# Patient Record
Sex: Female | Born: 1985 | Race: Black or African American | Hispanic: No | Marital: Single | Smoking: Current every day smoker
Health system: Southern US, Community
[De-identification: ages and names within clinical notes are randomized; demographics above are authoritative.]

---
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
## 2006-05-09 ENCOUNTER — Emergency Department (HOSPITAL_COMMUNITY): Admission: EM | Admit: 2006-05-09 | Discharge: 2006-05-10 | Payer: Self-pay | Admitting: Emergency Medicine

## 2006-05-31 ENCOUNTER — Emergency Department (HOSPITAL_COMMUNITY): Admission: EM | Admit: 2006-05-31 | Discharge: 2006-05-31 | Payer: Self-pay | Admitting: Emergency Medicine

## 2006-08-30 ENCOUNTER — Emergency Department (HOSPITAL_COMMUNITY): Admission: EM | Admit: 2006-08-30 | Discharge: 2006-08-30 | Payer: Self-pay | Admitting: Family Medicine

## 2006-10-04 ENCOUNTER — Emergency Department (HOSPITAL_COMMUNITY): Admission: EM | Admit: 2006-10-04 | Discharge: 2006-10-04 | Payer: Self-pay | Admitting: Emergency Medicine

## 2006-11-01 ENCOUNTER — Emergency Department (HOSPITAL_COMMUNITY): Admission: EM | Admit: 2006-11-01 | Discharge: 2006-11-01 | Payer: Self-pay | Admitting: Emergency Medicine

## 2006-11-06 ENCOUNTER — Emergency Department (HOSPITAL_COMMUNITY): Admission: EM | Admit: 2006-11-06 | Discharge: 2006-11-06 | Payer: Self-pay | Admitting: Emergency Medicine

## 2006-11-08 ENCOUNTER — Emergency Department (HOSPITAL_COMMUNITY): Admission: EM | Admit: 2006-11-08 | Discharge: 2006-11-08 | Payer: Self-pay | Admitting: Emergency Medicine

## 2006-12-23 ENCOUNTER — Emergency Department (HOSPITAL_COMMUNITY): Admission: EM | Admit: 2006-12-23 | Discharge: 2006-12-23 | Payer: Self-pay | Admitting: Emergency Medicine

## 2007-05-11 ENCOUNTER — Emergency Department (HOSPITAL_COMMUNITY): Admission: EM | Admit: 2007-05-11 | Discharge: 2007-05-11 | Payer: Self-pay | Admitting: Emergency Medicine

## 2007-12-11 ENCOUNTER — Emergency Department (HOSPITAL_COMMUNITY): Admission: EM | Admit: 2007-12-11 | Discharge: 2007-12-11 | Payer: Self-pay | Admitting: Emergency Medicine
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
## 2008-06-28 ENCOUNTER — Emergency Department (HOSPITAL_COMMUNITY): Admission: EM | Admit: 2008-06-28 | Discharge: 2008-06-28 | Payer: Self-pay | Admitting: Emergency Medicine

## 2008-08-18 ENCOUNTER — Emergency Department (HOSPITAL_COMMUNITY): Admission: EM | Admit: 2008-08-18 | Discharge: 2008-08-18 | Payer: Self-pay | Admitting: Emergency Medicine

## 2009-05-13 ENCOUNTER — Emergency Department (HOSPITAL_COMMUNITY): Admission: EM | Admit: 2009-05-13 | Discharge: 2009-05-13 | Payer: Self-pay | Admitting: Emergency Medicine

## 2009-07-11 ENCOUNTER — Emergency Department (HOSPITAL_COMMUNITY): Admission: EM | Admit: 2009-07-11 | Discharge: 2009-07-11 | Payer: Self-pay | Admitting: Emergency Medicine

## 2009-07-14 ENCOUNTER — Inpatient Hospital Stay (HOSPITAL_COMMUNITY): Admission: AD | Admit: 2009-07-14 | Discharge: 2009-07-14 | Payer: Self-pay | Admitting: Obstetrics & Gynecology

## 2009-10-27 IMAGING — CR DG CHEST 2V
2 series · 2 of 2 positions shown · non-contrast
Comparison: 12/23/2006

CLINICAL DATA: 22-year-old with cough.

CHEST - 2 VIEW

[w chest pa]
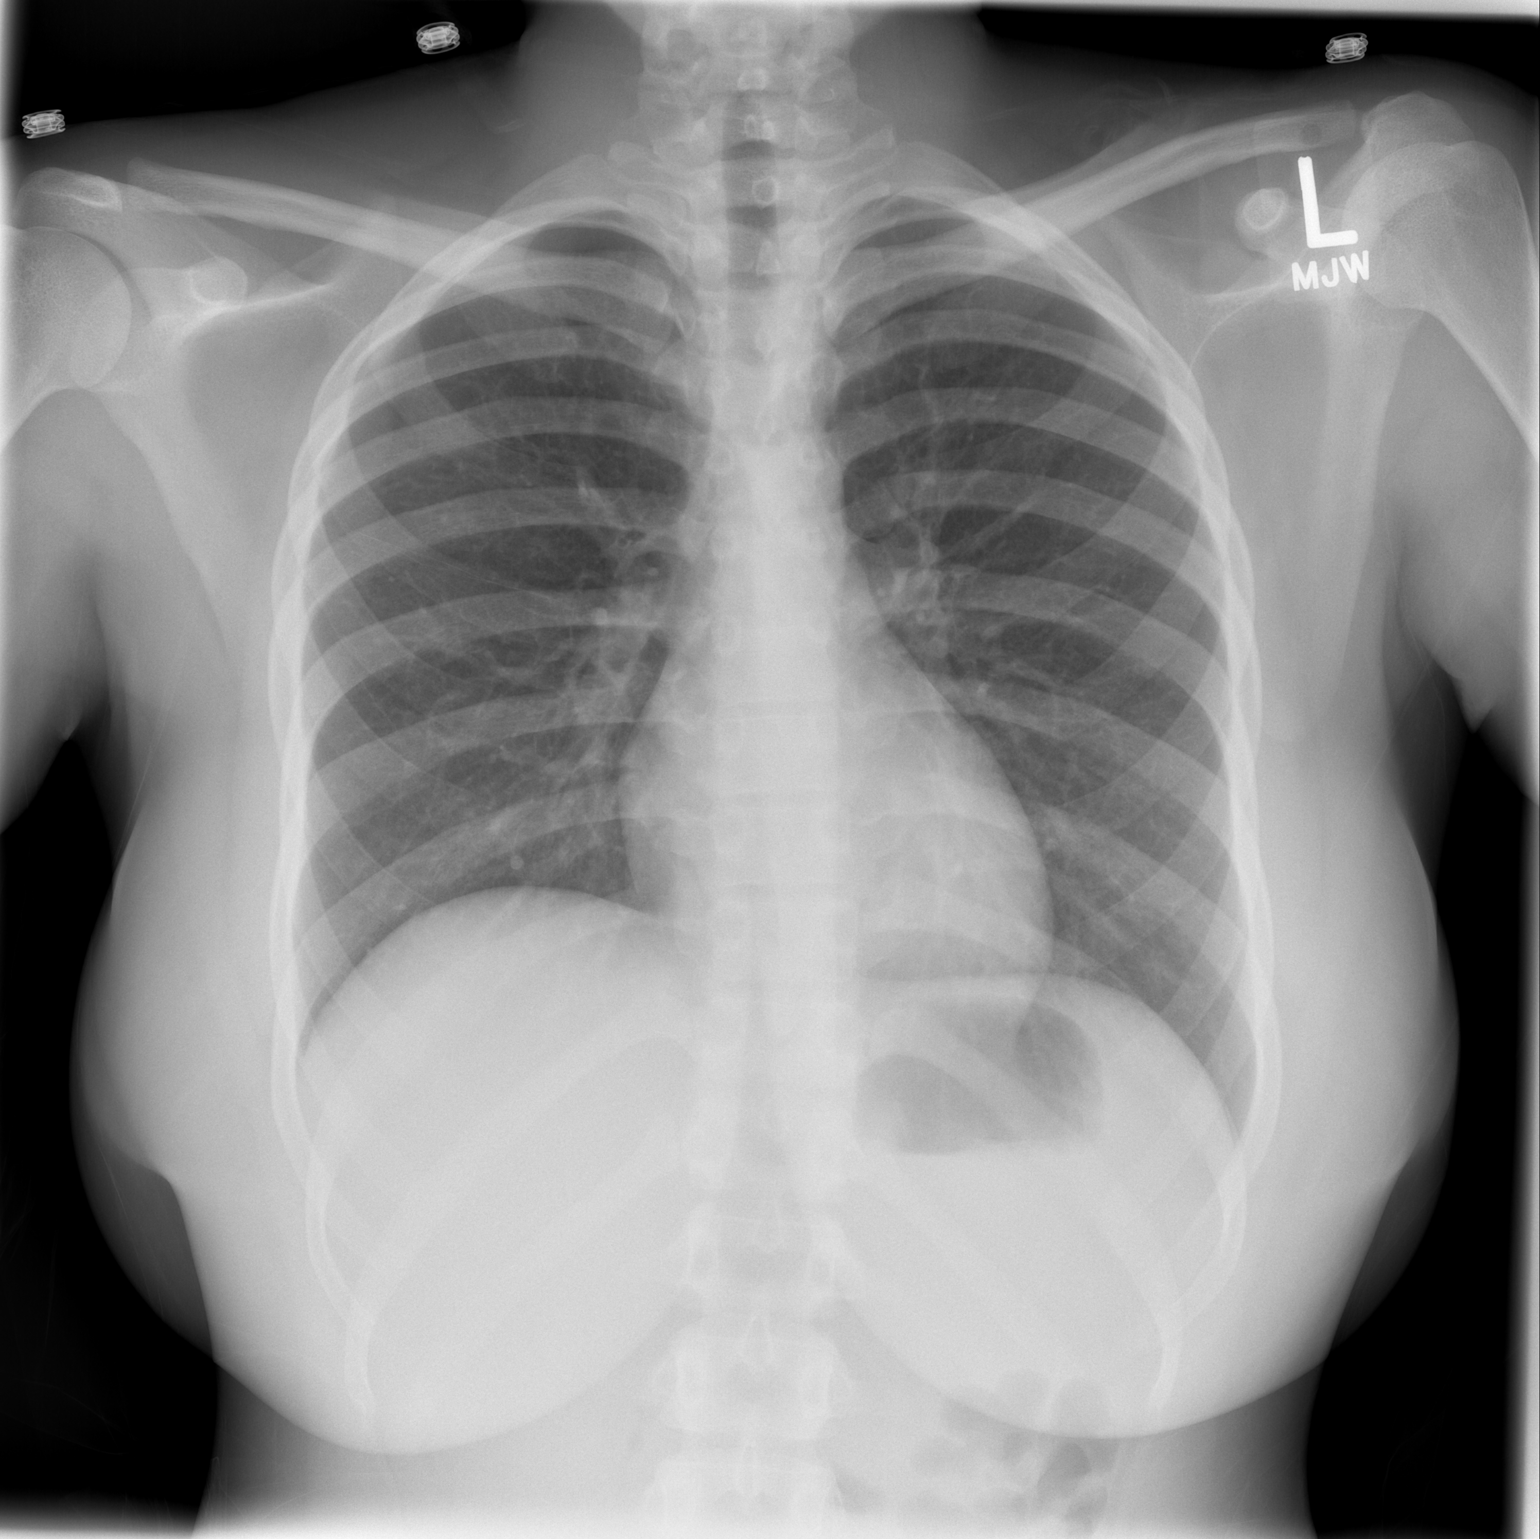

[w chest lat]
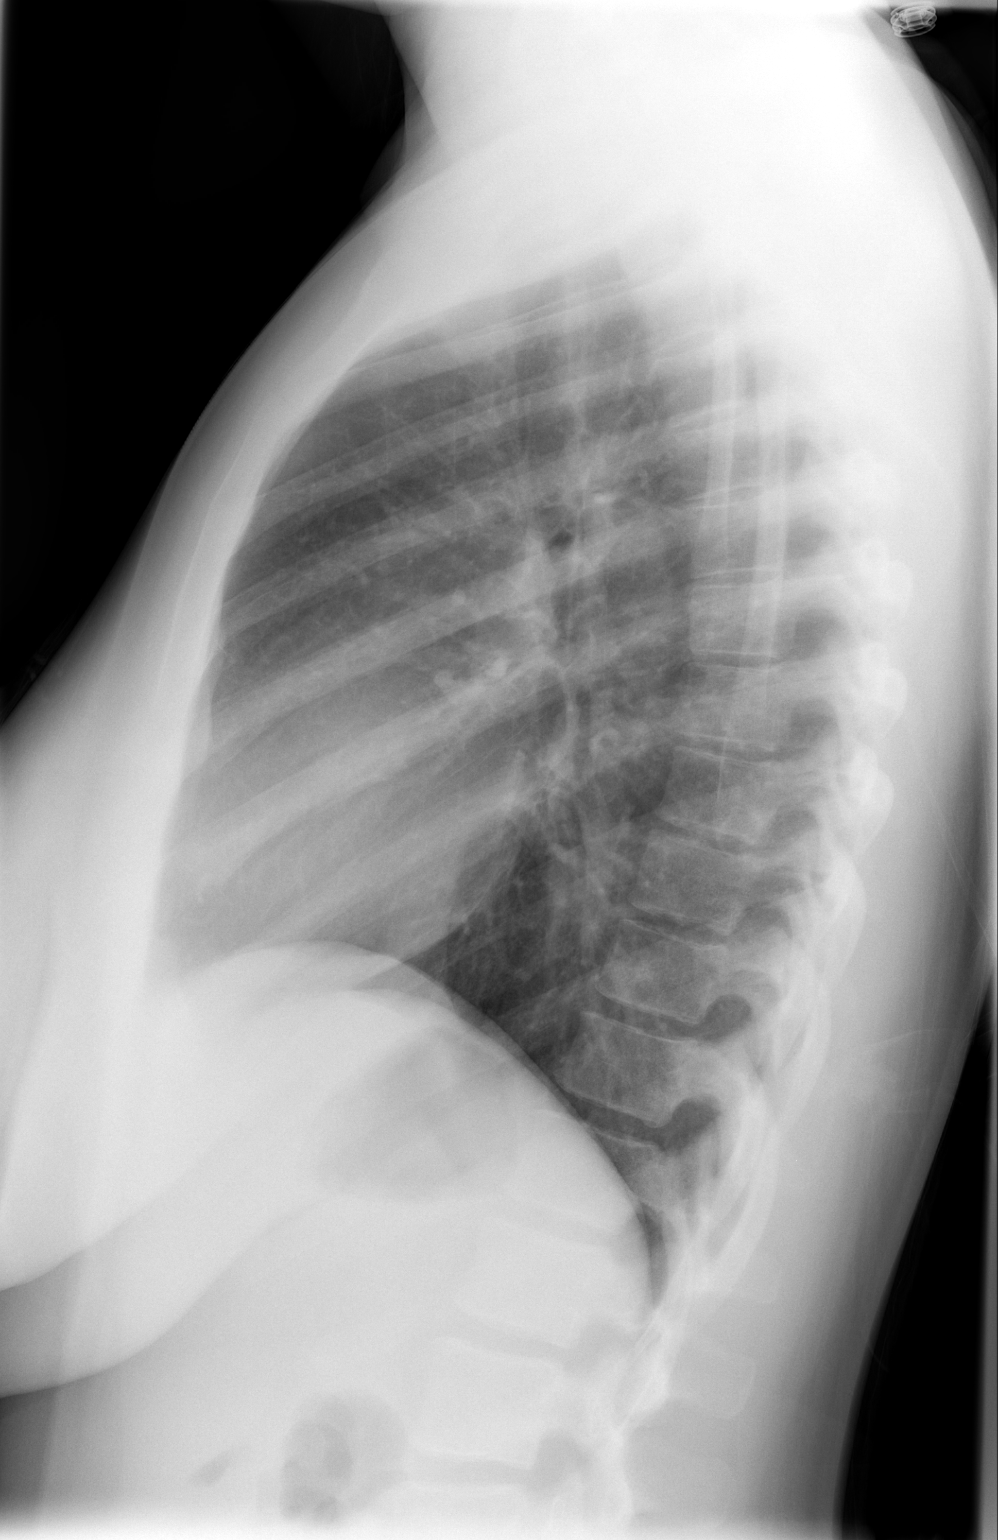

[2 of 2 positions shown; findings below may reference images not displayed]

FINDINGS: Two views of the chest demonstrate clear lungs.  Heart
and mediastinum are within normal limits.  The trachea is midline.
Bony structures are intact. There is a round lucency along the
lateral aspect of the left clavicle which is indeterminate but
possibly related to the marker on the study.
IMPRESSION: No acute chest findings.

## 2010-01-04 ENCOUNTER — Emergency Department (HOSPITAL_COMMUNITY): Admission: EM | Admit: 2010-01-04 | Discharge: 2010-01-04 | Payer: Self-pay | Admitting: Emergency Medicine

## 2010-05-17 LAB — CULTURE, ROUTINE-ABSCESS: Culture: NO GROWTH

## 2010-05-24 LAB — URINALYSIS, ROUTINE W REFLEX MICROSCOPIC
Glucose, UA: NEGATIVE mg/dL
Ketones, ur: NEGATIVE mg/dL
Leukocytes, UA: NEGATIVE
Nitrite: NEGATIVE
Protein, ur: 30 mg/dL — AB
Protein, ur: NEGATIVE mg/dL
Urobilinogen, UA: 0.2 mg/dL (ref 0.0–1.0)
pH: 5.5 (ref 5.0–8.0)

## 2010-05-24 LAB — WET PREP, GENITAL
Clue Cells Wet Prep HPF POC: NONE SEEN
Trich, Wet Prep: NONE SEEN
Yeast Wet Prep HPF POC: NONE SEEN

## 2010-05-24 LAB — URINE MICROSCOPIC-ADD ON

## 2010-06-13 LAB — URINALYSIS, ROUTINE W REFLEX MICROSCOPIC
Protein, ur: NEGATIVE mg/dL
Urobilinogen, UA: 1 mg/dL (ref 0.0–1.0)

## 2010-06-13 LAB — URINE MICROSCOPIC-ADD ON

## 2010-06-15 LAB — CBC
HCT: 39 % (ref 36.0–46.0)
Hemoglobin: 13 g/dL (ref 12.0–15.0)
MCHC: 33.2 g/dL (ref 30.0–36.0)
RBC: 4.66 MIL/uL (ref 3.87–5.11)
RDW: 14.2 % (ref 11.5–15.5)

## 2010-06-15 LAB — URINALYSIS, ROUTINE W REFLEX MICROSCOPIC
Bilirubin Urine: NEGATIVE
Hgb urine dipstick: NEGATIVE
Ketones, ur: NEGATIVE mg/dL
Nitrite: NEGATIVE
pH: 6 (ref 5.0–8.0)

## 2010-06-15 LAB — DIFFERENTIAL
Basophils Absolute: 0 10*3/uL (ref 0.0–0.1)
Eosinophils Relative: 1 % (ref 0–5)
Lymphocytes Relative: 21 % (ref 12–46)
Monocytes Absolute: 0.3 10*3/uL (ref 0.1–1.0)
Monocytes Relative: 6 % (ref 3–12)
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
## 2010-11-28 LAB — POCT PREGNANCY, URINE: Operator id: 257131

## 2010-11-28 LAB — WET PREP, GENITAL

## 2010-11-28 LAB — URINE MICROSCOPIC-ADD ON

## 2010-11-28 LAB — URINALYSIS, ROUTINE W REFLEX MICROSCOPIC
Bilirubin Urine: NEGATIVE
Glucose, UA: NEGATIVE
Ketones, ur: NEGATIVE
pH: 6

## 2010-12-03 ENCOUNTER — Emergency Department (HOSPITAL_COMMUNITY)
Admission: EM | Admit: 2010-12-03 | Discharge: 2010-12-03 | Disposition: A | Discharge status: Home / Self Care | Payer: Self-pay | Attending: Emergency Medicine | Admitting: Emergency Medicine

## 2010-12-03 DIAGNOSIS — IMO0002 Reserved for concepts with insufficient information to code with codable children: Secondary | ICD-10-CM | POA: Insufficient documentation

## 2010-12-03 DIAGNOSIS — T169XXA Foreign body in ear, unspecified ear, initial encounter: Secondary | ICD-10-CM | POA: Insufficient documentation

## 2010-12-05 LAB — CBC
HCT: 42.6
Hemoglobin: 13.9
MCHC: 32.8
MCV: 83
RDW: 13.8

## 2010-12-05 LAB — DIFFERENTIAL
Basophils Absolute: 0
Basophils Relative: 0
Eosinophils Relative: 2
Monocytes Absolute: 0.3

## 2010-12-05 LAB — RPR: RPR Ser Ql: NONREACTIVE

## 2010-12-14 LAB — URINALYSIS, ROUTINE W REFLEX MICROSCOPIC
Bilirubin Urine: NEGATIVE
Glucose, UA: NEGATIVE
Hgb urine dipstick: NEGATIVE
Ketones, ur: NEGATIVE
Nitrite: NEGATIVE
Protein, ur: NEGATIVE
Specific Gravity, Urine: 1.025
Urobilinogen, UA: 0.2
pH: 5.5

## 2010-12-14 LAB — CBC
HCT: 41.2
Hemoglobin: 13.4
MCHC: 32.5
MCV: 80.9
Platelets: 262
RBC: 5.09
RDW: 14.7 — ABNORMAL HIGH
WBC: 4.9

## 2010-12-14 LAB — GC/CHLAMYDIA PROBE AMP, GENITAL
Chlamydia, DNA Probe: NEGATIVE
GC Probe Amp, Genital: NEGATIVE

## 2010-12-14 LAB — POCT PREGNANCY, URINE
Operator id: 285491
Preg Test, Ur: NEGATIVE

## 2010-12-14 LAB — WET PREP, GENITAL
Clue Cells Wet Prep HPF POC: NONE SEEN
Trich, Wet Prep: NONE SEEN
WBC, Wet Prep HPF POC: NONE SEEN
Yeast Wet Prep HPF POC: NONE SEEN

## 2013-03-26 ENCOUNTER — Emergency Department (HOSPITAL_COMMUNITY)
Admission: EM | Admit: 2013-03-26 | Discharge: 2013-03-26 | Disposition: A | Discharge status: Home / Self Care | Payer: Self-pay | Attending: Emergency Medicine | Admitting: Emergency Medicine

## 2013-03-26 DIAGNOSIS — R6889 Other general symptoms and signs: Secondary | ICD-10-CM | POA: Insufficient documentation

## 2013-03-26 DIAGNOSIS — R21 Rash and other nonspecific skin eruption: Secondary | ICD-10-CM | POA: Insufficient documentation

## 2013-03-26 DIAGNOSIS — R059 Cough, unspecified: Secondary | ICD-10-CM | POA: Insufficient documentation

## 2013-03-26 DIAGNOSIS — H579 Unspecified disorder of eye and adnexa: Secondary | ICD-10-CM | POA: Insufficient documentation

## 2013-03-26 DIAGNOSIS — J3489 Other specified disorders of nose and nasal sinuses: Secondary | ICD-10-CM | POA: Insufficient documentation

## 2013-03-26 DIAGNOSIS — R05 Cough: Secondary | ICD-10-CM | POA: Insufficient documentation

## 2013-03-26 MED ORDER — PREDNISONE 20 MG PO TABS: ORAL_TABLET | ORAL | Status: DC

## 2013-03-26 MED ORDER — SULFAMETHOXAZOLE-TRIMETHOPRIM 800-160 MG PO TABS: 1.0000 | ORAL_TABLET | Freq: Two times a day (BID) | ORAL | Status: DC

## 2013-03-26 MED ORDER — CEPHALEXIN 500 MG PO CAPS: 500.0000 mg | ORAL_CAPSULE | Freq: Four times a day (QID) | ORAL | Status: DC

## 2013-03-26 NOTE — ED Provider Notes (Signed)
Medical screening examination/treatment/procedure(s) were performed by non-physician practitioner and as supervising physician I was immediately available for consultation/collaboration.  Flint MelterElliott L Zayden Hahne, MD 03/26/13 [phone]

## 2013-03-26 NOTE — ED Provider Notes (Signed)
CSN: [id number]     Arrival date & time 03/26/13  0840 History   First MD Initiated Contact with Patient 03/26/13 0900     Chief Complaint  Patient presents with  . Facial Swelling   (Consider location/radiation/quality/duration/timing/severity/associated sxs/prior Treatment) HPI  28 year old female presents complaining of facial swelling. Patient states she develops recurrent and facial swelling around her eyes and nose usually for several days and is recurrent around this time each year. For the past 2-3 days she has noticed redness above the nose and around her right eyelid. Describe as mild achy sensation with associated pruritus. She also has some URI symptoms including runny nose, sneezing, cough, itchy eyes. Symptoms improved with taking Benadryl. No associated fever, headache, vision changes, pain with eye movement, jaw pain, chest pain shortness of breath. States she has been seen for this problem in the past and usually prednisone seems to help. She denies any recent trauma but admits to recent sick contact.  No changes in soap, detergent, medication or new pets.   No past medical history on file. No past surgical history on file. No family history on file. History  Substance Use Topics  . Smoking status: Not on file  . Smokeless tobacco: Not on file  . Alcohol Use: Not on file   OB History   No data available     Review of Systems  Constitutional: Negative for fever.  Skin: Positive for rash.    Allergies  Review of patient's allergies indicates no known allergies.  Home Medications  No current outpatient prescriptions on file. BP 103/80  Pulse 111  Temp(Src) 98.6 F (37 C) (Oral)  Resp 14  Ht 5\' 4"  (1.626 m)  Wt 180 lb (81.647 kg)  BMI 30.88 kg/m2  SpO2 100% Physical Exam  Nursing note and vitals reviewed. Constitutional: She appears well-developed and well-nourished. No distress.  HENT:  Head: Atraumatic.  Right Ear: External ear normal.  Left Ear:  External ear normal.  Nose: Nose normal.  Mouth/Throat: Oropharynx is clear and moist.  Eyes: Conjunctivae and EOM are normal. Pupils are equal, round, and reactive to light. Right eye exhibits no discharge. Left eye exhibits no discharge.  Neck: Neck supple.  Lymphadenopathy:    She has no cervical adenopathy.  Neurological: She is alert.  Skin: Rash (erythematous skin changes noted to R upper eyelid and to bridge of nose with mild skin induration to bridge of nose, nontender to palpation, no fluctuance.  ) noted.  Psychiatric: She has a normal mood and affect.    ED Course  Procedures (including critical care time)  9:17 AM Pt with erythematous rash to R upper eyelid and to bridge of nose.  Rash has some resemblance of malar rash but not as significant.  Dry skin to bridge of nose resemble eczema.  Sxs resemble cellulitis, but since pt report prednisone has helped her in the past will d/c with keflex, bactrim, and a course of prednisone.  Pt made aware to return in 48 hrs if no improvement.  Pt agrees with plan.    Labs Review Labs Reviewed - No data to display Imaging Review No results found.  EKG Interpretation   None       MDM   1. Rash of face    BP 103/80  Pulse 111  Temp(Src) 98.6 F (37 C) (Oral)  Resp 14  Ht 5\' 4"  (1.626 m)  Wt 180 lb (81.647 kg)  BMI 30.88 kg/m2  SpO2 100%  Fayrene HelperBowie Nakeya Adinolfi, PA-C 03/26/13 [phone]

## 2013-03-26 NOTE — Discharge Instructions (Signed)

## 2013-03-26 NOTE — ED Notes (Signed)
Pt reports to ED for right eye swelling and erythema, pt states that eye swelling is a recurrent issue for which she has been seen and treated several times. Pt states that in the past she has been treated with prednisone, which has worked.

## 2013-04-21 ENCOUNTER — Encounter (HOSPITAL_COMMUNITY): Payer: Self-pay | Admitting: Emergency Medicine

## 2013-04-21 ENCOUNTER — Emergency Department (HOSPITAL_COMMUNITY)
Admission: EM | Admit: 2013-04-21 | Discharge: 2013-04-21 | Disposition: A | Discharge status: Home / Self Care | Payer: Self-pay | Attending: Emergency Medicine | Admitting: Emergency Medicine

## 2013-04-21 DIAGNOSIS — Z3202 Encounter for pregnancy test, result negative: Secondary | ICD-10-CM | POA: Insufficient documentation

## 2013-04-21 DIAGNOSIS — R21 Rash and other nonspecific skin eruption: Secondary | ICD-10-CM | POA: Insufficient documentation

## 2013-04-21 DIAGNOSIS — T4995XA Adverse effect of unspecified topical agent, initial encounter: Secondary | ICD-10-CM | POA: Insufficient documentation

## 2013-04-21 DIAGNOSIS — F17200 Nicotine dependence, unspecified, uncomplicated: Secondary | ICD-10-CM | POA: Insufficient documentation

## 2013-04-21 LAB — POCT PREGNANCY, URINE: PREG TEST UR: NEGATIVE

## 2013-04-21 MED ORDER — DOXYCYCLINE HYCLATE 100 MG PO CAPS: 100.0000 mg | ORAL_CAPSULE | Freq: Two times a day (BID) | ORAL | Status: DC

## 2013-04-21 MED ORDER — PREDNISONE 50 MG PO TABS: 50.0000 mg | ORAL_TABLET | Freq: Every day | ORAL | Status: DC

## 2013-04-21 NOTE — Progress Notes (Signed)
[facility] provided pt with a list of primary care resources and a [facility] Orange Card application to help patient establish primary care.  °

## 2013-04-21 NOTE — ED Notes (Signed)
Per pt, left eye swollen since yesterday-was here for the same symptoms but in right eye 2 weeks ago-was diagnosed with sinus infection and rash

## 2013-04-21 NOTE — Discharge Instructions (Signed)
Take Benadryl as well.  Followup with the, dermatologist.  Return here as needed

## 2013-04-21 NOTE — ED Notes (Signed)
Bed: WA24 Expected date:  Expected time:  Means of arrival:  Comments: 

## 2013-04-21 NOTE — ED Provider Notes (Signed)
CSN: [id number]     Arrival date & time 04/21/13  [id number] History   First MD Initiated Contact with Patient 04/21/13 [phone]     Chief Complaint  Patient presents with  . Facial Swelling     (Consider location/radiation/quality/duration/timing/severity/associated sxs/prior Treatment) HPI Patient presents to the emergency department with rash swelling around the left side of her nose.  Patient, states she's had similar symptoms previously, and was seen in the emergency department.  She states that she was given steroids, antibiotics at that time.  The patient, states, that the areas that hurt, that itches.  She states that she's not take any medications prior to arrival.  Patient, states she does have a history of allergies  Patient denies  blurred vision, weakness, dizziness  shortness of breath, nausea, vomiting, fever or syncope  History reviewed. No pertinent past medical history. History reviewed. No pertinent past surgical history. No family history on file. History  Substance Use Topics  . Smoking status: Current Every Day Smoker  . Smokeless tobacco: Not on file  . Alcohol Use: No   OB History   Grav Para Term Preterm Abortions TAB SAB Ect Mult Living                 Review of Systems   All other systems negative except as documented in the HPI. All pertinent positives and negatives as reviewed in the HPI. Allergies  Review of patient's allergies indicates no known allergies.  Home Medications   Current Outpatient Rx  Name  Route  Sig  Dispense  Refill  . acetaminophen (TYLENOL) 500 MG tablet   Oral   Take 1,000 mg by mouth every 6 (six) hours as needed for mild pain or moderate pain.         Marland Kitchen. aspirin-acetaminophen-caffeine (EXCEDRIN EXTRA STRENGTH) 250-250-65 MG per tablet   Oral   Take 2 tablets by mouth every 6 (six) hours as needed (For pain.).         Marland Kitchen. Multiple Vitamin (MULTIVITAMIN WITH MINERALS) TABS tablet   Oral   Take 1 tablet by mouth daily.         Marland Kitchen. triamcinolone (NASACORT ALLERGY 24HR) 55 MCG/ACT AERO nasal inhaler   Nasal   Place 2 sprays into the nose daily as needed (For congestion.).          BP 138/83  Pulse 93  Temp(Src) 98.9 F (37.2 C) (Oral)  Resp 17  SpO2 100%  LMP 03/28/2013 Physical Exam  Constitutional: She appears well-developed and well-nourished. No distress.  HENT:  Head: Normocephalic and atraumatic.  Nose:    Cardiovascular: Normal rate, regular rhythm and normal heart sounds.   Pulmonary/Chest: Effort normal and breath sounds normal.  Skin: Skin is warm and dry.    ED Course  Procedures (including critical care time) Patient be treated for a allergic type dermatitis, based on her physical exam findings.  There is no warmth or pain to the area.  Patient's main complaint is itching.  She has a dermatology appointment, and advised to followup with them   Carlyle DollyChristopher W Amayah Staheli, PA-C 04/21/13 1051

## 2013-04-23 NOTE — ED Provider Notes (Signed)
Medical screening examination/treatment/procedure(s) were conducted as a shared visit with non-physician practitioner(s) and myself.  I personally evaluated the patient during the encounter.  EKG Interpretation   None       Pt notes hx facial 'allergies', states had developed swelling/itching about right eye, now improved, now states same to left side of nose and above left eye. No pain. No fever or chills. No orbital cellulitis.     Suzi RootsKevin E Hortencia Martire, MD 04/23/13 [phone]

## 2013-06-04 ENCOUNTER — Emergency Department (HOSPITAL_COMMUNITY)
Admission: EM | Admit: 2013-06-04 | Discharge: 2013-06-04 | Disposition: A | Discharge status: Home / Self Care | Payer: Self-pay | Attending: Emergency Medicine | Admitting: Emergency Medicine

## 2013-06-04 ENCOUNTER — Emergency Department (HOSPITAL_COMMUNITY)
Admission: EM | Admit: 2013-06-04 | Discharge: 2013-06-04 | Discharge status: Against Medical Advice | Payer: Self-pay | Attending: Emergency Medicine | Admitting: Emergency Medicine

## 2013-06-04 ENCOUNTER — Encounter (HOSPITAL_COMMUNITY): Payer: Self-pay | Admitting: Emergency Medicine

## 2013-06-04 DIAGNOSIS — Z79899 Other long term (current) drug therapy: Secondary | ICD-10-CM | POA: Insufficient documentation

## 2013-06-04 DIAGNOSIS — K089 Disorder of teeth and supporting structures, unspecified: Secondary | ICD-10-CM | POA: Insufficient documentation

## 2013-06-04 DIAGNOSIS — F17200 Nicotine dependence, unspecified, uncomplicated: Secondary | ICD-10-CM | POA: Insufficient documentation

## 2013-06-04 DIAGNOSIS — K047 Periapical abscess without sinus: Secondary | ICD-10-CM | POA: Insufficient documentation

## 2013-06-04 DIAGNOSIS — R509 Fever, unspecified: Secondary | ICD-10-CM | POA: Insufficient documentation

## 2013-06-04 MED ORDER — HYDROCODONE-ACETAMINOPHEN 5-325 MG PO TABS
1.0000 | ORAL_TABLET | Freq: Once | ORAL | Status: AC
  Administered 2013-06-04: 1 via ORAL
  Filled 2013-06-04: qty 1

## 2013-06-04 MED ORDER — AMOXICILLIN 500 MG PO CAPS: 500.0000 mg | ORAL_CAPSULE | Freq: Three times a day (TID) | ORAL | Status: DC

## 2013-06-04 MED ORDER — HYDROCODONE-ACETAMINOPHEN 5-325 MG PO TABS: 1.0000 | ORAL_TABLET | ORAL | Status: DC | PRN

## 2013-06-04 MED ORDER — AMOXICILLIN 500 MG PO CAPS
500.0000 mg | ORAL_CAPSULE | Freq: Once | ORAL | Status: AC
  Administered 2013-06-04: 500 mg via ORAL
  Filled 2013-06-04: qty 1

## 2013-06-04 NOTE — Discharge Instructions (Signed)
Use warm compresses over your infected area. Followup with a dentist or oral surgeon tomorrow for continued evaluation and treatment. Return at any time to the emergency room for worsening symptoms. Take all of the antibiotics as prescribed for the full length of time.    Dental Abscess A dental abscess is a collection of infected fluid (pus) from a bacterial infection in the inner part of the tooth (pulp). It usually occurs at the end of the tooth's root.  CAUSES   Severe tooth decay.  Trauma to the tooth that allows bacteria to enter into the pulp, such as a broken or chipped tooth. SYMPTOMS   Severe pain in and around the infected tooth.  Swelling and redness around the abscessed tooth or in the mouth or face.  Tenderness.  Pus drainage.  Bad breath.  Bitter taste in the mouth.  Difficulty swallowing.  Difficulty opening the mouth.  Nausea.  Vomiting.  Chills.  Swollen neck glands. DIAGNOSIS   A medical and dental history will be taken.  An examination will be performed by tapping on the abscessed tooth.  X-rays may be taken of the tooth to identify the abscess. TREATMENT The goal of treatment is to eliminate the infection. You may be prescribed antibiotic medicine to stop the infection from spreading. A root canal may be performed to save the tooth. If the tooth cannot be saved, it may be pulled (extracted) and the abscess may be drained.  HOME CARE INSTRUCTIONS  Only take over-the-counter or prescription medicines for pain, fever, or discomfort as directed by your caregiver.  Rinse your mouth (gargle) often with salt water ( tsp salt in 8 oz [250 ml] of warm water) to relieve pain or swelling.  Do not drive after taking pain medicine (narcotics).  Do not apply heat to the outside of your face.  Return to your dentist for further treatment as directed. SEEK MEDICAL CARE IF:  Your pain is not helped by medicine.  Your pain is getting worse instead of  better. SEEK IMMEDIATE MEDICAL CARE IF:  You have a fever or persistent symptoms for more than 2 3 days.  You have a fever and your symptoms suddenly get worse.  You have chills or a very bad headache.  You have problems breathing or swallowing.  You have trouble opening your mouth.  You have swelling in the neck or around the eye. Document Released: 02/20/2005 Document Revised: 11/15/2011 Document Reviewed: 05/31/2010 [facility] Patient Information 2014 [place], [place].

## 2013-06-04 NOTE — ED Notes (Signed)
Pt reports "can't wait anymore", encouraged to return, ambulating with steady gait to exit, NAD upon leaving dept.

## 2013-06-04 NOTE — ED Notes (Signed)
Pt c/o swelling to L side of face and dental pain to upper L molar area. Pt states she has a cracked tooth in that area. Pt was here in ED earlier and left prior to evaluation by MD. Pt has no acute distress.

## 2013-06-04 NOTE — ED Notes (Signed)
Pt has a ride home.  

## 2013-06-04 NOTE — ED Notes (Signed)
Pt a+Ox4, reports dental pain, speaking full/clear sentences, yelling on the phone on initial contact.  Pt denies fevers/chills.  Skin PWD.  NAD.

## 2013-06-04 NOTE — ED Provider Notes (Signed)
CSN: [id number]     Arrival date & time 06/04/13  2211 History  This chart was scribed for non-physician practitioner, Ivonne Andrew, PA-C working with Gilda Crease, MD by Greggory Stallion, ED scribe. This patient was seen in room WTR8/WTR8 and the patient's care was started at 10:38 PM.   Chief Complaint  Patient presents with  . Facial Swelling  . Dental Pain   The history is provided by the patient. No language interpreter was used.   HPI Comments: Valerie Ashley is a 28 y.o. female who presents to the Emergency Department complaining of gradual onset, worsening left upper dental pain with associated facial swelling that started 2 days ago. She states she has three teeth that need to be pulled. Pt has had a low grade fever and intermittent chills. Eating worsens the pain. Denies sweats, swelling under the tongue.  Pt has not used any treatments. No other aggravating or alleviating factors.  No other associated symptoms.   No past medical history on file. No past surgical history on file. No family history on file. History  Substance Use Topics  . Smoking status: Current Every Day Smoker  . Smokeless tobacco: Not on file  . Alcohol Use: No   OB History   Grav Para Term Preterm Abortions TAB SAB Ect Mult Living                 Review of Systems  Constitutional: Positive for fever and chills.  HENT: Positive for dental problem and facial swelling.   All other systems reviewed and are negative.   Allergies  Review of patient's allergies indicates no known allergies.  Home Medications   Current Outpatient Rx  Name  Route  Sig  Dispense  Refill  . ibuprofen (ADVIL,MOTRIN) 800 MG tablet   Oral   Take 800 mg by mouth every 8 (eight) hours as needed.         . Multiple Vitamin (MULTIVITAMIN WITH MINERALS) TABS tablet   Oral   Take 1 tablet by mouth daily.          BP 113/86  Pulse 97  Temp(Src) 99.3 F (37.4 C)  Resp 16  SpO2 100%  Physical Exam  Nursing  note and vitals reviewed. Constitutional: She is oriented to person, place, and time. She appears well-developed and well-nourished. No distress.  HENT:  Head: Normocephalic and atraumatic.  Left upper first molar decayed down to gumline. Adjacent gingival swelling into face with slight loss of nasal labial fold. No periorbital swelling.  Eyes: Conjunctivae and EOM are normal. Pupils are equal, round, and reactive to light.  Neck: Normal range of motion. Neck supple. No tracheal deviation present.  No meningeal signs  Cardiovascular: Normal rate.   Pulmonary/Chest: Effort normal. No stridor. No respiratory distress. She has no wheezes.  Musculoskeletal: Normal range of motion.  Neurological: She is alert and oriented to person, place, and time.  Skin: Skin is warm and dry.  Psychiatric: She has a normal mood and affect. Her behavior is normal.    ED Course  Procedures   DIAGNOSTIC STUDIES: Oxygen Saturation is 100% on RA, normal by my interpretation.    COORDINATION OF CARE: 10:43 PM-the patient seen and evaluated. Patient appears well in no acute distress. Does not appear severely ill or toxic. No signs of drainable dental abscess at this time.  Discussed treatment plan which includes an antibiotic, pain medication and warm compresses with pt at bedside and pt agreed to plan. Advised  pt to follow up with a dentist.    MDM   Final diagnoses:  Dental abscess    I personally performed the services described in this documentation, which was scribed in my presence. The recorded information has been reviewed and is accurate.   Angus Sellereter S Raeley Gilmore, PA-C 06/04/13 2250

## 2013-06-07 NOTE — ED Provider Notes (Signed)
Medical screening examination/treatment/procedure(s) were performed by non-physician practitioner and as supervising physician I was immediately available for consultation/collaboration.   EKG Interpretation None        Avin Upperman J. Agripina Guyette, MD 06/07/13 0849 

## 2013-07-24 DIAGNOSIS — M25569 Pain in unspecified knee: Secondary | ICD-10-CM | POA: Insufficient documentation

## 2013-07-24 DIAGNOSIS — F17200 Nicotine dependence, unspecified, uncomplicated: Secondary | ICD-10-CM | POA: Insufficient documentation

## 2013-07-25 ENCOUNTER — Encounter (HOSPITAL_COMMUNITY): Payer: Self-pay | Admitting: Emergency Medicine

## 2013-07-25 ENCOUNTER — Emergency Department (HOSPITAL_COMMUNITY)
Admission: EM | Admit: 2013-07-25 | Discharge: 2013-07-25 | Discharge status: Against Medical Advice | Payer: Self-pay | Attending: Emergency Medicine | Admitting: Emergency Medicine

## 2013-07-25 NOTE — ED Notes (Signed)
Patient called, no answer.

## 2013-07-25 NOTE — ED Notes (Signed)
Patient ambulatory on arrival. Walking in lobby with slight limp.

## 2013-07-25 NOTE — ED Notes (Signed)
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 Pt a&o x4, NAD noted at this time
